# Patient Record
Sex: Male | Born: 2000 | Race: Black or African American | Hispanic: No | Marital: Single | Smoking: Never smoker
Health system: Southern US, Community
[De-identification: ages and names within clinical notes are randomized; demographics above are authoritative.]

## PROBLEM LIST (undated history)

## (undated) DIAGNOSIS — Z789 Other specified health status: Secondary | ICD-10-CM

---
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
## 2023-12-16 ENCOUNTER — Encounter (HOSPITAL_COMMUNITY): Payer: Self-pay

## 2023-12-16 ENCOUNTER — Ambulatory Visit (HOSPITAL_COMMUNITY)
Admission: RE | Admit: 2023-12-16 | Discharge: 2023-12-16 | Disposition: A | Discharge status: Home / Self Care | Payer: Medicaid Other | Source: Ambulatory Visit | Attending: Emergency Medicine | Admitting: Emergency Medicine

## 2023-12-16 VITALS — BP 129/67 | HR 67 | Temp 98.2°F | Resp 18

## 2023-12-16 DIAGNOSIS — R253 Fasciculation: Secondary | ICD-10-CM | POA: Insufficient documentation

## 2023-12-16 HISTORY — DX: Other specified health status: Z78.9

## 2023-12-16 LAB — BASIC METABOLIC PANEL
Anion gap: 15 (ref 5–15)
BUN: 10 mg/dL (ref 6–20)
CO2: 25 mmol/L (ref 22–32)
Calcium: 9.9 mg/dL (ref 8.9–10.3)
Chloride: 103 mmol/L (ref 98–111)
Creatinine, Ser: 1.1 mg/dL (ref 0.61–1.24)
GFR, Estimated: >60 mL/min (ref >60)
Glucose, Bld: 66 mg/dL — ABNORMAL LOW (ref 70–99)
Potassium: 4.1 mmol/L (ref 3.5–5.1)
Sodium: 143 mmol/L (ref 135–145)

## 2023-12-16 NOTE — ED Provider Notes (Signed)
[facility]    CSN: [id number] Arrival date & time: 12/16/23  1109      History   Chief Complaint Chief Complaint  Patient presents with   Twitching    HPI Devin Hill is a 23 y.o. male.   Patient presents with intermittent twitching to upper body since June 2024. Described twitching as muscles contractions. Denies numbness, tingling, weakness, pain, or anxiety.      Past Medical History:  Diagnosis Date   No pertinent past medical history     There are no active problems to display for this patient.   History reviewed. No pertinent surgical history.     Home Medications    Prior to Admission medications   Medication Sig Start Date End Date Taking? Authorizing Provider  ibuprofen (ADVIL,MOTRIN) 100 MG/5ML suspension Take 9 mLs (180 mg total) by mouth every 8 (eight) hours as needed for pain. 10/09/12   Moreno-Coll, Adlih, MD    Family History History reviewed. No pertinent family history.  Social History Social History   Tobacco Use   Smoking status: Never   Smokeless tobacco: Never  Vaping Use   Vaping status: Every Day  Substance Use Topics   Alcohol use: Yes   Drug use: Yes    Types: Marijuana     Allergies   Patient has no allergy information on record.   Review of Systems Review of Systems  Per HPI  Physical Exam Triage Vital Signs ED Triage Vitals  Encounter Vitals Group     BP 12/16/23 1126 129/67     Systolic BP Percentile --      Diastolic BP Percentile --      Pulse Rate 12/16/23 1126 67     Resp 12/16/23 1126 18     Temp 12/16/23 1126 98.2 F (36.8 C)     Temp Source 12/16/23 1126 Oral     SpO2 12/16/23 1126 99 %     Weight --      Height --      Head Circumference --      Peak Flow --      Pain Score 12/16/23 1124 0     Pain Loc --      Pain Education --      Exclude from Growth Chart --    No data found.  Updated Vital Signs BP 129/67 (BP Location: Left Arm)   Pulse 67   Temp 98.2 F (36.8 C)  (Oral)   Resp 18   SpO2 99%   Visual Acuity Right Eye Distance:   Left Eye Distance:   Bilateral Distance:    Right Eye Near:   Left Eye Near:    Bilateral Near:     Physical Exam Vitals and nursing note reviewed.  Constitutional:      General: He is not in acute distress.    Appearance: Normal appearance. He is not ill-appearing.  Cardiovascular:     Rate and Rhythm: Normal rate and regular rhythm.  Pulmonary:     Effort: Pulmonary effort is normal.     Breath sounds: Normal breath sounds.  Musculoskeletal:        General: Normal range of motion.     Cervical back: Normal range of motion and neck supple.  Skin:    General: Skin is warm and dry.  Neurological:     General: No focal deficit present.     Mental Status: He is alert and oriented to person, place, and time. Mental status is  at baseline.     GCS: GCS eye subscore is 4. GCS verbal subscore is 5. GCS motor subscore is 6.     Cranial Nerves: Cranial nerves 2-12 are intact.     Sensory: Sensation is intact.     Motor: Motor function is intact.     Coordination: Coordination is intact.     Gait: Gait is intact.      UC Treatments / Results  Labs (all labs ordered are listed, but only abnormal results are displayed) Labs Reviewed  BASIC METABOLIC PANEL    EKG   Radiology No results found.  Procedures Procedures (including critical care time)  Medications Ordered in UC Medications - No data to display  Initial Impression / Assessment and Plan / UC Course  I have reviewed the triage vital signs and the nursing notes.  Pertinent labs & imaging results that were available during my care of the patient were reviewed by me and considered in my medical decision making (see chart for details).     Patient presented with intermittent upper body muscle twitching that he described as muscles contractions since June 2024. Denies any other symptoms.  No significant findings on assessment. GCS 15. No  neurodeficits noted. Ordered BMP to rule out electrolyte imbalance.  Had clinical staff set patient up with PCP prior to leaving clinic. Discussed follow-up and return precautions.  Final Clinical Impressions(s) / UC Diagnoses   Final diagnoses:  Muscle twitching     Discharge Instructions      Your lab results will come back later today, I will give you call if there is anything concerning. Make sure you are staying hydrated. Otherwise follow-up with primary care doctor that clinical staff set you up with today if symptoms persist. Return here as needed.     ED Prescriptions   None    PDMP not reviewed this encounter.   Wynonia Lawman A, NP 12/16/23 1156

## 2023-12-16 NOTE — ED Triage Notes (Signed)
Pt c/o of episodes where his whole body will twitch.   Start date: June 2024  Interventions: None

## 2023-12-16 NOTE — Discharge Instructions (Signed)
Your lab results will come back later today, I will give you call if there is anything concerning. Make sure you are staying hydrated. Otherwise follow-up with primary care doctor that clinical staff set you up with today if symptoms persist. Return here as needed.

## 2024-02-14 ENCOUNTER — Encounter (HOSPITAL_COMMUNITY): Payer: Self-pay

## 2024-02-14 ENCOUNTER — Ambulatory Visit (HOSPITAL_COMMUNITY)
Admission: RE | Admit: 2024-02-14 | Discharge: 2024-02-14 | Disposition: A | Discharge status: Home / Self Care | Source: Ambulatory Visit | Attending: Emergency Medicine | Admitting: Emergency Medicine

## 2024-02-14 VITALS — BP 118/70 | HR 83 | Temp 99.0°F | Resp 13

## 2024-02-14 DIAGNOSIS — L0501 Pilonidal cyst with abscess: Secondary | ICD-10-CM

## 2024-02-14 MED ORDER — SULFAMETHOXAZOLE-TRIMETHOPRIM 800-160 MG PO TABS: 1.0000 | ORAL_TABLET | Freq: Two times a day (BID) | ORAL | 0 refills | Status: AC

## 2024-02-14 NOTE — ED Triage Notes (Signed)
 Pt reports having recurring abscess in buttock area. Reports popped this morning, pt wanting to know what can do to keep from getting them.

## 2024-02-14 NOTE — Discharge Instructions (Addendum)
 Take the antibiotics twice daily with food for the next 7 days.  Continue to do warm compresses and soaks in warm water with Epsom salt and antibacterial solution such as Hibiclens.  You can take an or milligrams of ibuprofen every 8 hours to help with any pain and inflammation.  Since this is a reoccurring issue, consider following up with [facility] for complete removal.  Return to clinic for any new or urgent symptoms.

## 2024-02-14 NOTE — ED Provider Notes (Signed)
 [facility]    CSN: [id number] Arrival date & time: 02/14/24  1631      History   Chief Complaint Chief Complaint  Patient presents with   Appointment    HPI Devin Hill is a 23 y.o. male.   Patient presents to clinic over concern of reoccurring rectal abscesses.  Reports he will have a flareup around every 2 months.  Usually he does warm compresses at home and they drain on their own.  He has had 1 to the rectal area for the past 2 weeks, has been doing warm compresses and the area opened up last night to drain while he was in the shower. Is not having any pain currently. Has not had any fevers.   Has never been seen for this issue in the past, has never had antibiotics or I&D.   The history is provided by the patient and medical records.    Past Medical History:  Diagnosis Date   No pertinent past medical history     There are no active problems to display for this patient.   History reviewed. No pertinent surgical history.     Home Medications    Prior to Admission medications   Medication Sig Start Date End Date Taking? Authorizing Provider  sulfamethoxazole-trimethoprim (BACTRIM DS) 800-160 MG tablet Take 1 tablet by mouth 2 (two) times daily for 7 days. 02/14/24 02/21/24 Yes Harlow Lighter, Jae Skeet  N, FNP    Family History No family history on file.  Social History Social History   Tobacco Use   Smoking status: Never   Smokeless tobacco: Never  Vaping Use   Vaping status: Every Day  Substance Use Topics   Alcohol use: Yes   Drug use: Yes    Types: Marijuana     Allergies   Patient has no known allergies.   Review of Systems Review of Systems  Per HPI  Physical Exam Triage Vital Signs ED Triage Vitals  Encounter Vitals Group     BP 02/14/24 1649 118/70     Systolic BP Percentile --      Diastolic BP Percentile --      Pulse Rate 02/14/24 1649 83     Resp 02/14/24 1649 13     Temp 02/14/24 1649 99 F (37.2 C)     Temp Source  02/14/24 1649 Oral     SpO2 02/14/24 1649 97 %     Weight --      Height --      Head Circumference --      Peak Flow --      Pain Score 02/14/24 1647 0     Pain Loc --      Pain Education --      Exclude from Growth Chart --    No data found.  Updated Vital Signs BP 118/70 (BP Location: Left Arm)   Pulse 83   Temp 99 F (37.2 C) (Oral)   Resp 13   SpO2 97%   Visual Acuity Right Eye Distance:   Left Eye Distance:   Bilateral Distance:    Right Eye Near:   Left Eye Near:    Bilateral Near:     Physical Exam Vitals and nursing note reviewed. Exam conducted with a chaperone present.  Constitutional:      Appearance: Normal appearance.  HENT:     Head: Normocephalic and atraumatic.     Right Ear: External ear normal.     Left Ear: External ear normal.  Nose: Nose normal.     Mouth/Throat:     Mouth: Mucous membranes are moist.  Eyes:     Conjunctiva/sclera: Conjunctivae normal.  Cardiovascular:     Rate and Rhythm: Normal rate.  Pulmonary:     Effort: Pulmonary effort is normal. No respiratory distress.  Skin:    Findings: Abscess present.       Neurological:     General: No focal deficit present.     Mental Status: He is alert.  Psychiatric:        Mood and Affect: Mood normal.      UC Treatments / Results  Labs (all labs ordered are listed, but only abnormal results are displayed) Labs Reviewed - No data to display  EKG   Radiology No results found.  Procedures Procedures (including critical care time)  Medications Ordered in UC Medications - No data to display  Initial Impression / Assessment and Plan / UC Course  I have reviewed the triage vital signs and the nursing notes.  Pertinent labs & imaging results that were available during my care of the patient were reviewed by me and considered in my medical decision making (see chart for details).  Vitals in triage reviewed, patient is hemodynamically stable.  Chaperone present for  rectal exam, reveals draining abscess to the pilonidal area.  Infection seems localized, without fever or tachycardia.  Will start on Bactrim for MRSA coverage.  Symptomatic management for draining abscess discussed.  Encouraged [facility] follow-up if this is reoccurring.  Plan of care, follow-up care return precautions given, no questions at this time.     Final Clinical Impressions(s) / UC Diagnoses   Final diagnoses:  Pilonidal abscess     Discharge Instructions      Take the antibiotics twice daily with food for the next 7 days.  Continue to do warm compresses and soaks in warm water with Epsom salt and antibacterial solution such as Hibiclens.  You can take an or milligrams of ibuprofen every 8 hours to help with any pain and inflammation.  Since this is a reoccurring issue, consider following up with [facility] for complete removal.  Return to clinic for any new or urgent symptoms.    ED Prescriptions     Medication Sig Dispense Auth. Provider   sulfamethoxazole-trimethoprim (BACTRIM DS) 800-160 MG tablet Take 1 tablet by mouth 2 (two) times daily for 7 days. 14 tablet Harlow Lighter, Medford Staheli  N, FNP      PDMP not reviewed this encounter.   Harlow Lighter Makenzee Choudhry  N, FNP 02/14/24 [phone]

## 2024-03-29 ENCOUNTER — Ambulatory Visit (HOSPITAL_COMMUNITY)
Admission: RE | Admit: 2024-03-29 | Discharge: 2024-03-29 | Disposition: A | Discharge status: Home / Self Care | Source: Ambulatory Visit | Attending: Emergency Medicine | Admitting: Emergency Medicine

## 2024-03-29 ENCOUNTER — Encounter (HOSPITAL_COMMUNITY): Payer: Self-pay

## 2024-03-29 VITALS — BP 111/71 | HR 80 | Temp 98.0°F | Resp 16

## 2024-03-29 DIAGNOSIS — K611 Rectal abscess: Secondary | ICD-10-CM | POA: Insufficient documentation

## 2024-03-29 LAB — HIV ANTIBODY (ROUTINE TESTING W REFLEX): HIV Screen 4th Generation wRfx: NONREACTIVE

## 2024-03-29 MED ORDER — DOXYCYCLINE HYCLATE 100 MG PO CAPS: 100.0000 mg | ORAL_CAPSULE | Freq: Two times a day (BID) | ORAL | 0 refills | Status: AC

## 2024-03-29 NOTE — ED Triage Notes (Signed)
 Pt states abscess on his buttocks. States it is hard and not draining.

## 2024-03-29 NOTE — Discharge Instructions (Signed)
 Please take medication as prescribed. Take with food to avoid upset stomach. Finish ALL the pills!  I also recommend warm bath soaks and warm compress to the area  We are testing you for HIV - this should result tonight or tomorrow  Please call GI to make an appointment for follow up

## 2024-03-29 NOTE — ED Provider Notes (Signed)
 [facility]    CSN: [id number] Arrival date & time: 03/29/24  1104      History   Chief Complaint Chief Complaint  Patient presents with   Abscess    Entered by patient    HPI Devin Hill is a 23 y.o. male.  2 day history of bump near the rectum Not currently painful. No drainage. Able to pass stool without issue, no bleeding.  Denies fever, chills, nausea/vomiting.  He reports recurrent abscess in this area that comes and goes about every 2 months. Last seen a month ago although that one was more pilonidal than peri-rectal.  No recent HIV testing, reports negative in the past  Past Medical History:  Diagnosis Date   No pertinent past medical history     There are no active problems to display for this patient.   History reviewed. No pertinent surgical history.     Home Medications    Prior to Admission medications   Medication Sig Start Date End Date Taking? Authorizing Provider  doxycycline (VIBRAMYCIN) 100 MG capsule Take 1 capsule (100 mg total) by mouth 2 (two) times daily for 7 days. 03/29/24 04/05/24 Yes Chudney Scheffler, Beth Brooke    Family History History reviewed. No pertinent family history.  Social History Social History   Tobacco Use   Smoking status: Never   Smokeless tobacco: Never  Vaping Use   Vaping status: Every Day  Substance Use Topics   Alcohol use: Yes   Drug use: Yes    Types: Marijuana     Allergies   Patient has no known allergies.   Review of Systems Review of Systems As per HPI  Physical Exam Triage Vital Signs ED Triage Vitals  Encounter Vitals Group     BP 03/29/24 1116 111/71     Systolic BP Percentile --      Diastolic BP Percentile --      Pulse Rate 03/29/24 1116 80     Resp 03/29/24 1116 16     Temp 03/29/24 1116 98 F (36.7 C)     Temp Source 03/29/24 1116 Oral     SpO2 03/29/24 1116 98 %     Weight --      Height --      Head Circumference --      Peak Flow --      Pain Score 03/29/24  1115 0     Pain Loc --      Pain Education --      Exclude from Growth Chart --    No data found.  Updated Vital Signs BP 111/71 (BP Location: Left Arm)   Pulse 80   Temp 98 F (36.7 C) (Oral)   Resp 16   SpO2 98%   Visual Acuity Right Eye Distance:   Left Eye Distance:   Bilateral Distance:    Right Eye Near:   Left Eye Near:    Bilateral Near:     Physical Exam Vitals and nursing note reviewed. Exam conducted with a chaperone present Devin Hill).  Constitutional:      General: He is not in acute distress.    Appearance: Normal appearance.  HENT:     Mouth/Throat:     Pharynx: Oropharynx is clear.  Cardiovascular:     Rate and Rhythm: Normal rate and regular rhythm.     Heart sounds: Normal heart sounds.  Pulmonary:     Effort: Pulmonary effort is normal.     Breath sounds: Normal breath sounds.  Genitourinary:    Rectum: Mass present.       Comments: Small area of induration peri-rectal area. Minimally tender. No erythema or drainage. No external hemorrhoids. Neurological:     Mental Status: He is alert and oriented to person, place, and time.     UC Treatments / Results  Labs (all labs ordered are listed, but only abnormal results are displayed) Labs Reviewed  HIV ANTIBODY (ROUTINE TESTING W REFLEX)    EKG  Radiology No results found.  Procedures Procedures (including critical care time)  Medications Ordered in UC Medications - No data to display  Initial Impression / Assessment and Plan / UC Course  I have reviewed the triage vital signs and the nursing notes.  Pertinent labs & imaging results that were available during my care of the patient were reviewed by me and considered in my medical decision making (see chart for details).  Treat for emerging abscess with doxycycline BID x 7 days. Advised supportive care and pain control as needed, warm soaks and compress, keeping area clean, ibu/tylenol if needed. With recurrent infections in this  area, HIV test is pending. I have also recommended follow up with GI. Patient is agreeable to plan, no questions at this time  Final Clinical Impressions(s) / UC Diagnoses   Final diagnoses:  Peri-rectal abscess     Discharge Instructions      Please take medication as prescribed. Take with food to avoid upset stomach. Finish ALL the pills!  I also recommend warm bath soaks and warm compress to the area  We are testing you for HIV - this should result tonight or tomorrow  Please call GI to make an appointment for follow up   ED Prescriptions     Medication Sig Dispense Auth. Provider   doxycycline (VIBRAMYCIN) 100 MG capsule Take 1 capsule (100 mg total) by mouth 2 (two) times daily for 7 days. 14 capsule Melvie Paglia, Ivette Marks, PA-C      PDMP not reviewed this encounter.   Creighton Doffing, PA-C 03/29/24 1149

## 2024-04-14 ENCOUNTER — Ambulatory Visit (INDEPENDENT_AMBULATORY_CARE_PROVIDER_SITE_OTHER)

## 2024-04-14 ENCOUNTER — Ambulatory Visit (HOSPITAL_COMMUNITY)
Admission: EM | Admit: 2024-04-14 | Discharge: 2024-04-14 | Disposition: A | Discharge status: Home / Self Care | Attending: Emergency Medicine | Admitting: Emergency Medicine

## 2024-04-14 ENCOUNTER — Other Ambulatory Visit: Payer: Self-pay

## 2024-04-14 ENCOUNTER — Encounter (HOSPITAL_COMMUNITY): Payer: Self-pay | Admitting: *Deleted

## 2024-04-14 DIAGNOSIS — R0789 Other chest pain: Secondary | ICD-10-CM | POA: Diagnosis not present

## 2024-04-14 MED ORDER — BACLOFEN 10 MG PO TABS: 10.0000 mg | ORAL_TABLET | Freq: Three times a day (TID) | ORAL | 0 refills | Status: AC

## 2024-04-14 NOTE — ED Provider Notes (Signed)
 [facility]    CSN: [id number] Arrival date & time: 04/14/24  1738      History   Chief Complaint Chief Complaint  Patient presents with   Cough    HPI Devin Hill is a 23 y.o. male.   Patient presents with sharp right sided chest pain x 3 days.  Patient states that the pain is worse with movement and when he has an occasional cough.  Patient denies any persistent cough, congestion, headache, sore throat, fever, shortness of breath, nausea, vomiting, and diarrhea.  Patient reports that he does smoke and vape.  Denies any history of asthma or COPD.  Patient denies taking any medication for his symptoms.  The history is provided by the patient and medical records.  Cough   Past Medical History:  Diagnosis Date   No pertinent past medical history     There are no active problems to display for this patient.   History reviewed. No pertinent surgical history.     Home Medications    Prior to Admission medications   Medication Sig Start Date End Date Taking? Authorizing Provider  baclofen (LIORESAL) 10 MG tablet Take 1 tablet (10 mg total) by mouth 3 (three) times daily. 04/14/24  Yes Karon Packer, NP    Family History History reviewed. No pertinent family history.  Social History Social History   Tobacco Use   Smoking status: Never   Smokeless tobacco: Never  Vaping Use   Vaping status: Every Day  Substance Use Topics   Alcohol use: Yes   Drug use: Yes    Types: Marijuana     Allergies   Patient has no known allergies.   Review of Systems Review of Systems  Respiratory:  Positive for cough.    Per HPI  Physical Exam Triage Vital Signs ED Triage Vitals  Encounter Vitals Group     BP 04/14/24 1752 109/72     Systolic BP Percentile --      Diastolic BP Percentile --      Pulse Rate 04/14/24 1752 68     Resp 04/14/24 1752 20     Temp 04/14/24 1752 98.8 F (37.1 C)     Temp src --      SpO2 04/14/24 1752 98 %     Weight  --      Height --      Head Circumference --      Peak Flow --      Pain Score 04/14/24 1751 7     Pain Loc --      Pain Education --      Exclude from Growth Chart --    No data found.  Updated Vital Signs BP 109/72   Pulse 68   Temp 98.8 F (37.1 C)   Resp 20   SpO2 98%   Visual Acuity Right Eye Distance:   Left Eye Distance:   Bilateral Distance:    Right Eye Near:   Left Eye Near:    Bilateral Near:     Physical Exam Vitals and nursing note reviewed.  Constitutional:      General: He is awake. He is not in acute distress.    Appearance: Normal appearance. He is well-developed and well-groomed. He is not ill-appearing.  HENT:     Right Ear: Tympanic membrane, ear canal and external ear normal.     Left Ear: Tympanic membrane, ear canal and external ear normal.     Nose: Nose normal.  Mouth/Throat:     Mouth: Mucous membranes are moist.     Pharynx: Oropharynx is clear.  Cardiovascular:     Rate and Rhythm: Normal rate and regular rhythm.  Pulmonary:     Effort: Pulmonary effort is normal.     Breath sounds: Normal breath sounds.  Chest:     Chest wall: Tenderness present.    Musculoskeletal:        General: Normal range of motion.  Skin:    General: Skin is warm and dry.  Neurological:     Mental Status: He is alert.  Psychiatric:        Behavior: Behavior is cooperative.      UC Treatments / Results  Labs (all labs ordered are listed, but only abnormal results are displayed) Labs Reviewed - No data to display  EKG   Radiology DG Chest 2 View Result Date: 04/14/2024 CLINICAL DATA:  Right-sided chest pain for 3 days. EXAM: CHEST - 2 VIEW COMPARISON:  None Available. FINDINGS: The heart size and mediastinal contours are within normal limits. Both lungs are clear. The visualized skeletal structures are unremarkable. IMPRESSION: No active cardiopulmonary disease. Electronically Signed   By: Rosalene Colon M.D.   On: 04/14/2024 18:48     Procedures Procedures (including critical care time)  Medications Ordered in UC Medications - No data to display  Initial Impression / Assessment and Plan / UC Course  I have reviewed the triage vital signs and the nursing notes.  Pertinent labs & imaging results that were available during my care of the patient were reviewed by me and considered in my medical decision making (see chart for details).     Patient is well-appearing.  Vitals are stable.  Lung and heart sounds normal.  Tenderness noted to right chest wall.  No other significant findings upon exam.  Chest x-ray ordered to rule out any underlying causes for the chest pain.  Based on my interpretation there is no active cardiopulmonary disease.  Radiology report confirms this.  Chest pain likely muscular in nature due to being reproducible upon palpation.  Prescribed baclofen as needed for muscle pain and spasms.  Discussed return and strict ER precautions. Final Clinical Impressions(s) / UC Diagnoses   Final diagnoses:  Chest wall pain     Discharge Instructions      Your chest x-ray is negative for any pneumonia or underlying cause for your chest pain. I believe your chest pain is likely muscular in nature. You can take baclofen every 8 hours as needed for your chest wall pain. Otherwise you can alternate between 650 mg of Tylenol and 400 mg of ibuprofen  every 6-8 hours as needed for pain. Return here as needed. If you develop worsening chest pain, shortness of breath, dizziness, lightheadedness, or loss of consciousness please seek immediate medical treatment in the emergency department.   ED Prescriptions     Medication Sig Dispense Auth. Provider   baclofen (LIORESAL) 10 MG tablet Take 1 tablet (10 mg total) by mouth 3 (three) times daily. 30 each Karon Packer, NP      PDMP not reviewed this encounter.   Levora Reas A, NP 04/14/24 1907

## 2024-04-14 NOTE — Discharge Instructions (Addendum)
 Your chest x-ray is negative for any pneumonia or underlying cause for your chest pain. I believe your chest pain is likely muscular in nature. You can take baclofen every 8 hours as needed for your chest wall pain. Otherwise you can alternate between 650 mg of Tylenol and 400 mg of ibuprofen  every 6-8 hours as needed for pain. Return here as needed. If you develop worsening chest pain, shortness of breath, dizziness, lightheadedness, or loss of consciousness please seek immediate medical treatment in the emergency department.

## 2024-04-14 NOTE — ED Triage Notes (Signed)
 PT reports for 3 day he has a sharpe pain on Rt side of chest when coughing.THE pain in the RT side also increases with movement.
